# Patient Record
Sex: Male | Born: 1965 | Race: White | Hispanic: No | Marital: Married | Smoking: Never smoker
Health system: Southern US, Community
[De-identification: ages and names within clinical notes are randomized; demographics above are authoritative.]

## PROBLEM LIST (undated history)

## (undated) DIAGNOSIS — T7840XA Allergy, unspecified, initial encounter: Secondary | ICD-10-CM

## (undated) HISTORY — DX: Allergy, unspecified, initial encounter: T78.40XA

## (undated) HISTORY — PX: APPENDECTOMY: SHX54

---

## 2012-03-05 ENCOUNTER — Ambulatory Visit
Admission: RE | Admit: 2012-03-05 | Discharge: 2012-03-05 | Disposition: A | Discharge status: Home / Self Care | Payer: BC Managed Care – PPO | Source: Ambulatory Visit | Attending: Unknown Physician Specialty | Admitting: Unknown Physician Specialty

## 2012-03-05 ENCOUNTER — Other Ambulatory Visit: Payer: Self-pay | Admitting: Unknown Physician Specialty

## 2012-03-05 DIAGNOSIS — R202 Paresthesia of skin: Secondary | ICD-10-CM

## 2014-12-17 ENCOUNTER — Other Ambulatory Visit: Payer: Self-pay | Admitting: Unknown Physician Specialty

## 2014-12-17 DIAGNOSIS — G8929 Other chronic pain: Secondary | ICD-10-CM

## 2014-12-17 DIAGNOSIS — M545 Low back pain, unspecified: Secondary | ICD-10-CM

## 2014-12-22 ENCOUNTER — Ambulatory Visit
Admission: RE | Admit: 2014-12-22 | Discharge: 2014-12-22 | Disposition: A | Discharge status: Home / Self Care | Payer: BLUE CROSS/BLUE SHIELD | Source: Ambulatory Visit | Attending: Unknown Physician Specialty | Admitting: Unknown Physician Specialty

## 2014-12-22 DIAGNOSIS — M545 Low back pain, unspecified: Secondary | ICD-10-CM

## 2014-12-22 DIAGNOSIS — G8929 Other chronic pain: Secondary | ICD-10-CM

## 2014-12-31 ENCOUNTER — Other Ambulatory Visit: Payer: Self-pay | Admitting: Unknown Physician Specialty

## 2014-12-31 ENCOUNTER — Ambulatory Visit
Admission: RE | Admit: 2014-12-31 | Discharge: 2014-12-31 | Disposition: A | Discharge status: Home / Self Care | Payer: BLUE CROSS/BLUE SHIELD | Source: Ambulatory Visit | Attending: Unknown Physician Specialty | Admitting: Unknown Physician Specialty

## 2014-12-31 DIAGNOSIS — M545 Low back pain: Principal | ICD-10-CM

## 2014-12-31 DIAGNOSIS — G8929 Other chronic pain: Secondary | ICD-10-CM

## 2016-03-15 IMAGING — CR DG LUMBAR SPINE COMPLETE 4+V
5 series · 5 of 5 positions shown · non-contrast
Comparison: MRI of the lumbar spine December 22, 2014 and CT scan
of the abdomen and pelvis July 04, 2010.

CLINICAL DATA: Several month history of low back pain

EXAM:
LUMBAR SPINE - COMPLETE 4+ VIEW

[w lumbar spine ap]
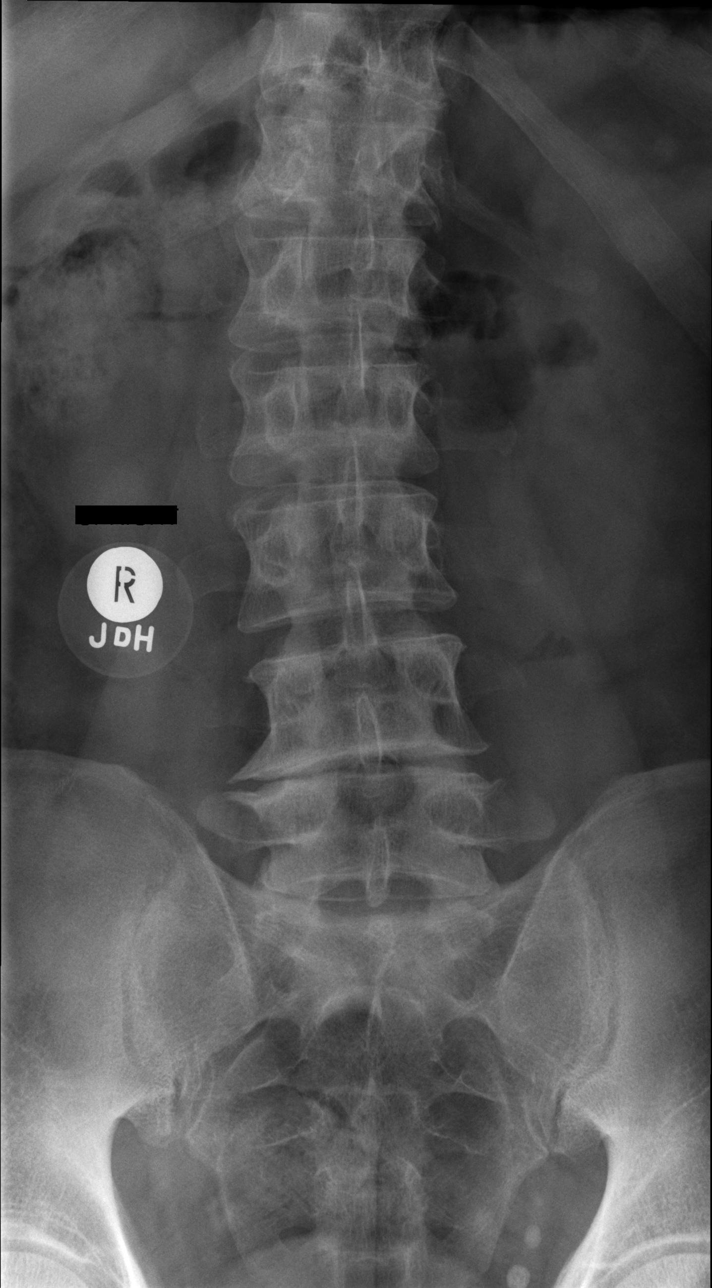

[w lumbar spine obl (1 of 2)]
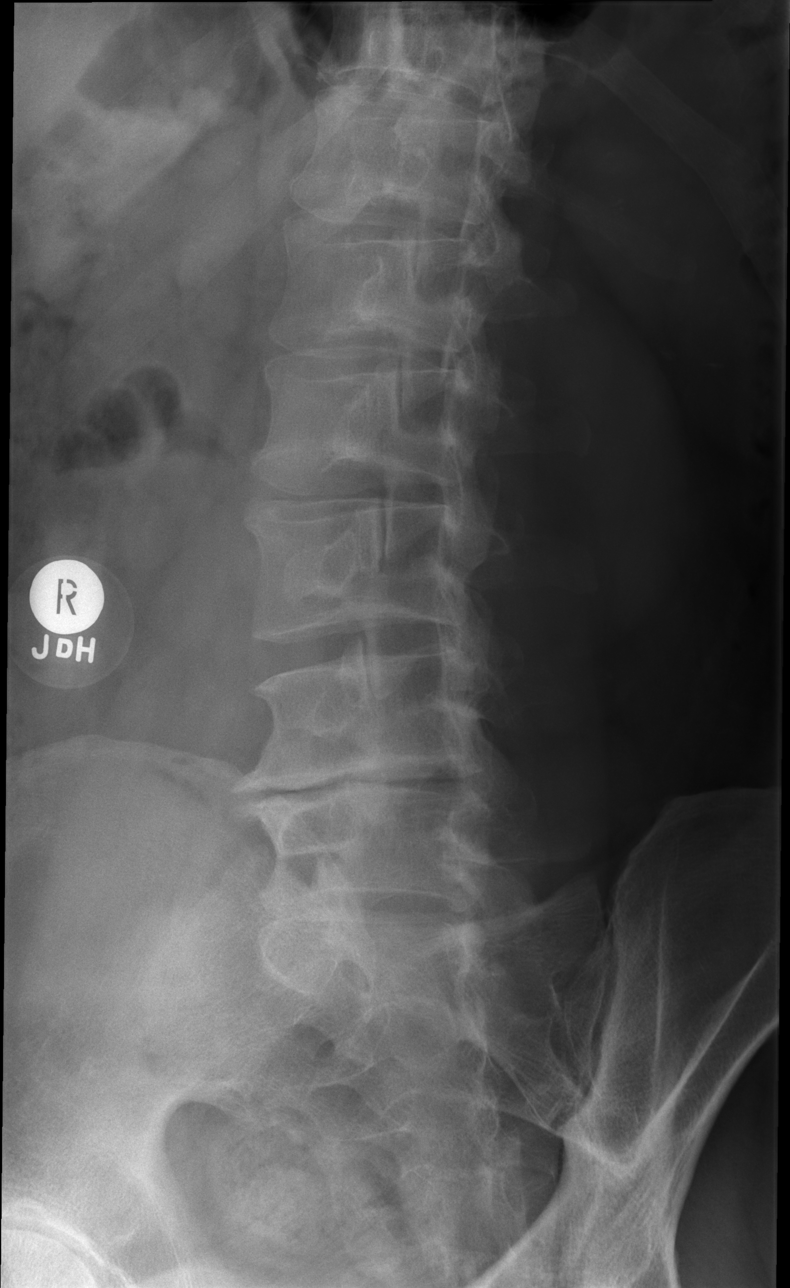

[w lumbar spine obl (2 of 2)]
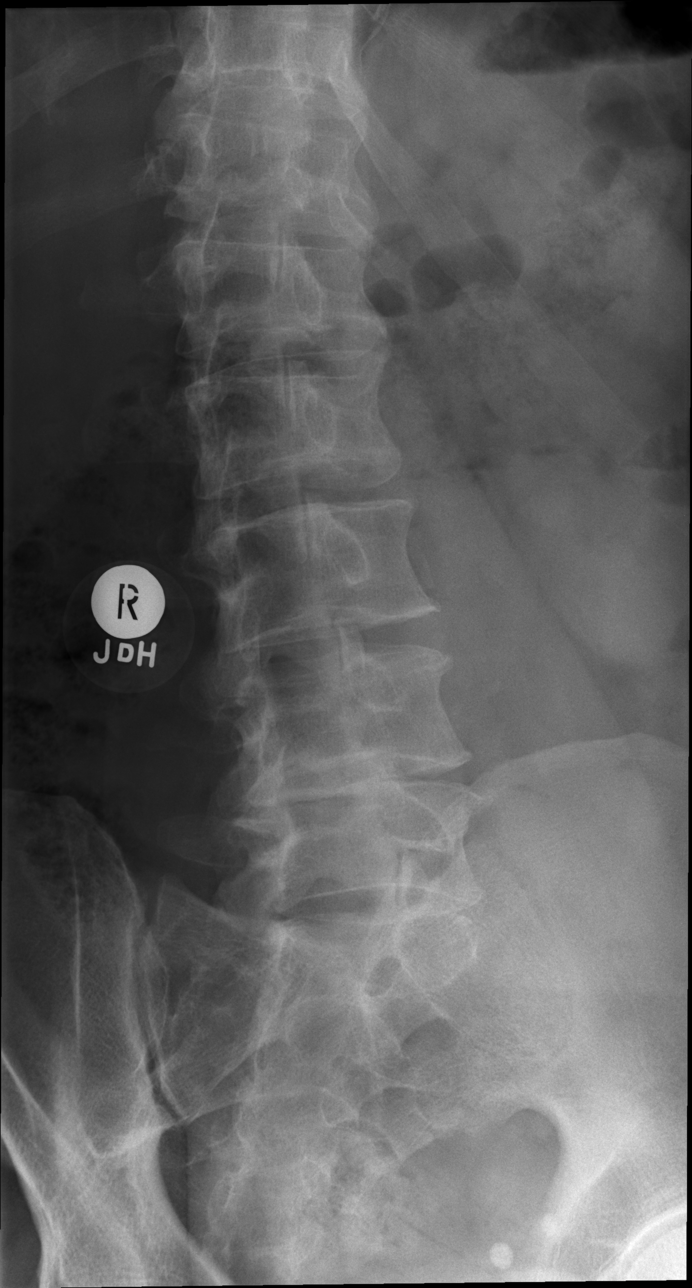

[w lumbar spine lat]
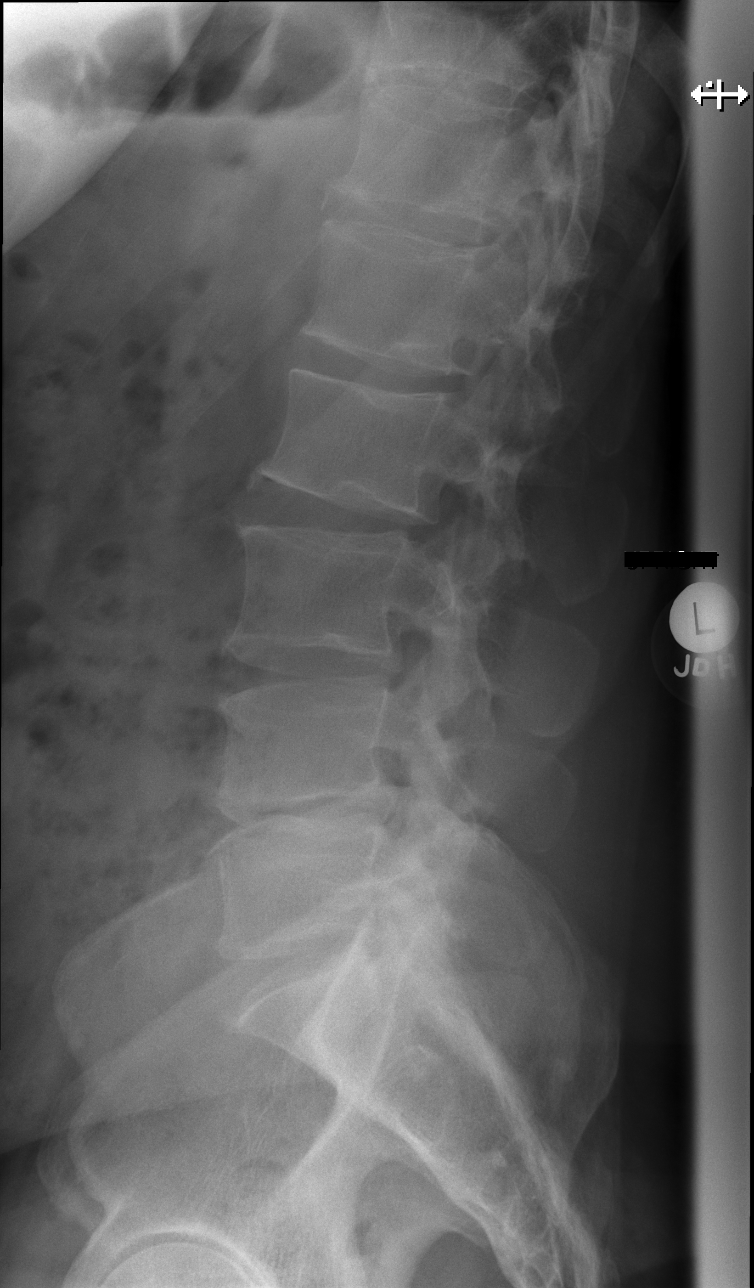

[w lumbar l-5 s-1 spot]
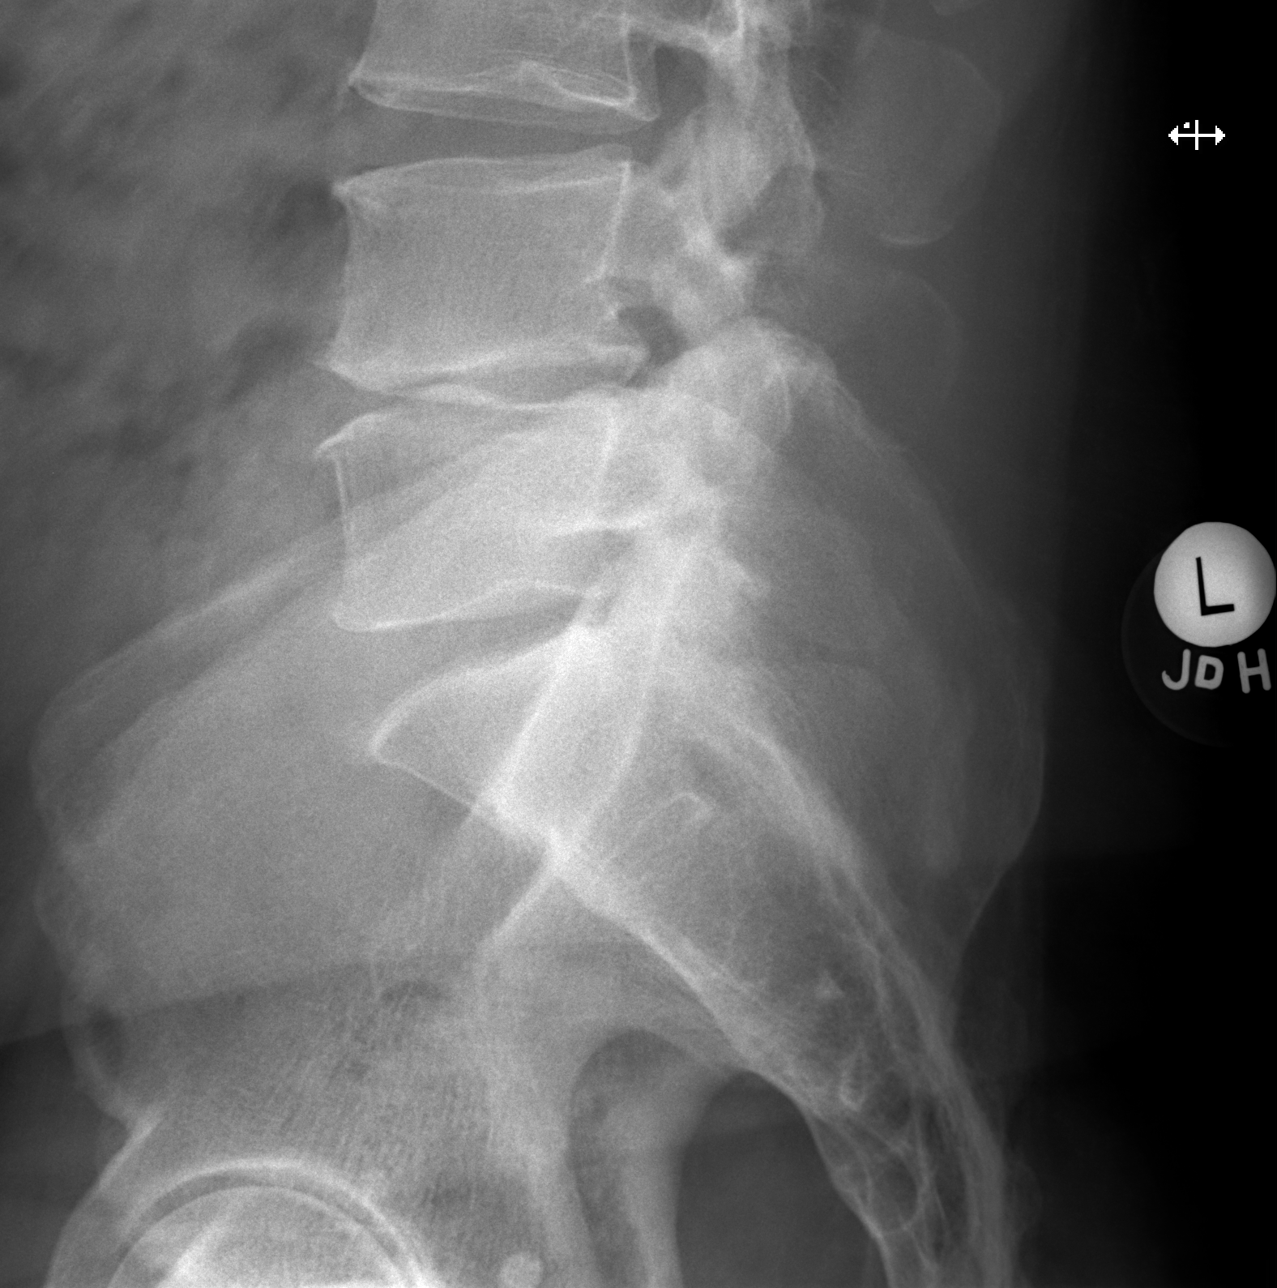

[5 of 5 positions shown; findings below may reference images not displayed]

FINDINGS: The lumbar vertebral bodies are preserved in height. Mild chronic
dextro curvature centered in the upper lumbar spine is noted. The
pedicles and transverse processes are intact. There is moderate
degenerative disc space narrowing at L4-5. There is no
spondylolisthesis. There is facet joint hypertrophy at L4-5 and at
L5-S1. The observed portions of the sacrum are normal.
IMPRESSION: There is degenerative disc disease at the L4-5 level with a
prominent right lateral near bridging osteophyte. There is no
compression fracture nor spondylolisthesis. There is mild facet
joint hypertrophy at L4-5 and at L5-S1.

## 2018-01-10 ENCOUNTER — Encounter: Payer: Self-pay | Admitting: Gastroenterology

## 2018-02-08 ENCOUNTER — Ambulatory Visit (AMBULATORY_SURGERY_CENTER): Payer: Self-pay

## 2018-02-08 VITALS — Ht 74.0 in | Wt 242.6 lb

## 2018-02-08 DIAGNOSIS — Z1211 Encounter for screening for malignant neoplasm of colon: Secondary | ICD-10-CM

## 2018-02-08 MED ORDER — NA SULFATE-K SULFATE-MG SULF 17.5-3.13-1.6 GM/177ML PO SOLN: 1.0000 | Freq: Once | ORAL | 0 refills | Status: AC

## 2018-02-08 NOTE — Progress Notes (Signed)
Denies allergies to eggs or soy products. Denies complication of anesthesia or sedation. Denies use of weight loss medication. Denies use of O2.   Emmi instructions declined.  

## 2018-02-25 ENCOUNTER — Encounter: Payer: Self-pay | Admitting: Gastroenterology

## 2018-03-01 ENCOUNTER — Ambulatory Visit (AMBULATORY_SURGERY_CENTER): Payer: BLUE CROSS/BLUE SHIELD | Admitting: Gastroenterology

## 2018-03-01 ENCOUNTER — Encounter: Payer: Self-pay | Admitting: Gastroenterology

## 2018-03-01 VITALS — BP 120/77 | HR 64 | Temp 97.5°F | Resp 17 | Ht 74.0 in | Wt 242.0 lb

## 2018-03-01 DIAGNOSIS — D123 Benign neoplasm of transverse colon: Secondary | ICD-10-CM

## 2018-03-01 DIAGNOSIS — Z1211 Encounter for screening for malignant neoplasm of colon: Secondary | ICD-10-CM | POA: Diagnosis present

## 2018-03-01 MED ORDER — SODIUM CHLORIDE 0.9 % IV SOLN: 500.0000 mL | Freq: Once | INTRAVENOUS | Status: DC

## 2018-03-01 NOTE — Progress Notes (Signed)
Pt's states no medical or surgical changes since previsit or office visit. 

## 2018-03-01 NOTE — Patient Instructions (Signed)
YOU HAD AN ENDOSCOPIC PROCEDURE TODAY AT [facility]:   Refer to the procedure report that was given to you for any specific questions about what was found during the examination.  If the procedure report does not answer your questions, please call your gastroenterologist to clarify.  If you requested that your care partner not be given the details of your procedure findings, then the procedure report has been included in a sealed envelope for you to review at your convenience later.  YOU SHOULD EXPECT: Some feelings of bloating in the abdomen. Passage of more gas than usual.  Walking can help get rid of the air that was put into your GI tract during the procedure and reduce the bloating. If you had a lower endoscopy (such as a colonoscopy or flexible sigmoidoscopy) you may notice spotting of blood in your stool or on the toilet paper. If you underwent a bowel prep for your procedure, you may not have a normal bowel movement for a few days.  Please Note:  You might notice some irritation and congestion in your nose or some drainage.  This is from the oxygen used during your procedure.  There is no need for concern and it should clear up in a day or so.  SYMPTOMS TO REPORT IMMEDIATELY:   Following lower endoscopy (colonoscopy or flexible sigmoidoscopy):  Excessive amounts of blood in the stool  Significant tenderness or worsening of abdominal pains  Swelling of the abdomen that is new, acute  Fever of 100F or higher   For urgent or emergent issues, a gastroenterologist can be reached at any hour by calling [phone].   DIET:  We do recommend a small meal at first, but then you may proceed to your regular diet.  Drink plenty of fluids but you should avoid alcoholic beverages for 24 hours.  ACTIVITY:  You should plan to take it easy for the rest of today and you should NOT DRIVE or use heavy machinery until tomorrow (because of the sedation medicines used during the test).     FOLLOW UP: Our staff will call the number listed on your records the next business day following your procedure to check on you and address any questions or concerns that you may have regarding the information given to you following your procedure. If we do not reach you, we will leave a message.  However, if you are feeling well and you are not experiencing any problems, there is no need to return our call.  We will assume that you have returned to your regular daily activities without incident.  If any biopsies were taken you will be contacted by phone or by letter within the next 1-3 weeks.  Please call us at [phone] if you have not heard about the biopsies in 3 weeks.    SIGNATURES/CONFIDENTIALITY: You and/or your care partner have signed paperwork which will be entered into your electronic medical record.  These signatures attest to the fact that that the information above on your After Visit Summary has been reviewed and is understood.  Full responsibility of the confidentiality of this discharge information lies with you and/or your care-partner.   Please see handout on polyps.   Thank you for allowing [place] to provide your healthcare today.

## 2018-03-01 NOTE — Op Note (Signed)
[facility] Patient Name: Phillip Moreno Procedure Date: 03/01/2018 10:18 AM MRN: [id number] Endoscopist: Remo Lipps P. Havery Moros , MD Age: 53 Referring MD:  Date of Birth: 11/11/1965 Gender: Male Account #: [id number] Procedure:                Colonoscopy Indications:              Screening for colorectal malignant neoplasm, This                            is the patient's first colonoscopy Medicines:                Monitored Anesthesia Care Procedure:                Pre-Anesthesia Assessment:                           - Prior to the procedure, a History and Physical                            was performed, and patient medications and                            allergies were reviewed. The patient's tolerance of                            previous anesthesia was also reviewed. The risks                            and benefits of the procedure and the sedation                            options and risks were discussed with the patient.                            All questions were answered, and informed consent                            was obtained. Prior Anticoagulants: The patient has                            taken no previous anticoagulant or antiplatelet                            agents. ASA Grade Assessment: II - A patient with                            mild systemic disease. After reviewing the risks                            and benefits, the patient was deemed in                            satisfactory condition to undergo the procedure.  After obtaining informed consent, the colonoscope                            was passed under direct vision. Throughout the                            procedure, the patient's blood pressure, pulse, and                            oxygen saturations were monitored continuously. The                            Colonoscope was introduced through the anus and                            advanced to the the  cecum, identified by                            appendiceal orifice and ileocecal valve. The                            colonoscopy was performed without difficulty. The                            patient tolerated the procedure well. The quality                            of the bowel preparation was good. The ileocecal                            valve, appendiceal orifice, and rectum were                            photographed. Scope In: 10:21:35 AM Scope Out: 10:42:14 AM Scope Withdrawal Time: 0 hours 13 minutes 38 seconds  Total Procedure Duration: 0 hours 20 minutes 39 seconds  Findings:                 The perianal and digital rectal examinations were                            normal.                           A diminutive polyp was found in the transverse                            colon. The polyp was sessile. The polyp was removed                            with a cold snare. Resection and retrieval were                            complete.  Internal hemorrhoids were found during                            retroflexion. The hemorrhoids were small.                           The exam was otherwise without abnormality. Complications:            No immediate complications. Estimated blood loss:                            Minimal. Estimated Blood Loss:     Estimated blood loss was minimal. Impression:               - One diminutive polyp in the transverse colon,                            removed with a cold snare. Resected and retrieved.                           - Small internal hemorrhoids.                           - The examination was otherwise normal. Recommendation:           - Patient has a contact number available for                            emergencies. The signs and symptoms of potential                            delayed complications were discussed with the                            patient. Return to normal activities tomorrow.                             Written discharge instructions were provided to the                            patient.                           - Resume previous diet.                           - Continue present medications.                           - Await pathology results. Remo Lipps P. Partick Musselman, MD 03/01/2018 10:44:58 AM This report has been signed electronically.

## 2018-03-01 NOTE — Progress Notes (Signed)
PT taken to PACU. Monitors in place. VSS. Report given to RN. 

## 2018-03-04 ENCOUNTER — Telehealth: Payer: Self-pay

## 2018-03-04 NOTE — Telephone Encounter (Signed)
  Follow up Call-  Call back number 03/01/2018  Post procedure Call Back phone  # [phone]  Permission to leave phone message Yes  Some recent data might be hidden     Patient questions:  Do you have a fever, pain , or abdominal swelling? No. Pain Score  0 *  Have you tolerated food without any problems? Yes.    Have you been able to return to your normal activities? Yes.    Do you have any questions about your discharge instructions: Diet   No. Medications  No. Follow up visit  No.  Do you have questions or concerns about your Care? No.  Actions: * If pain score is 4 or above: No action needed, pain <4.

## 2018-03-07 ENCOUNTER — Encounter: Payer: Self-pay | Admitting: Gastroenterology

## 2022-12-07 ENCOUNTER — Other Ambulatory Visit: Payer: Self-pay | Admitting: Internal Medicine

## 2022-12-07 DIAGNOSIS — E785 Hyperlipidemia, unspecified: Secondary | ICD-10-CM

## 2022-12-11 ENCOUNTER — Encounter: Payer: Self-pay | Admitting: Gastroenterology

## 2022-12-26 ENCOUNTER — Encounter: Payer: Self-pay | Admitting: Gastroenterology

## 2023-01-09 ENCOUNTER — Ambulatory Visit (HOSPITAL_BASED_OUTPATIENT_CLINIC_OR_DEPARTMENT_OTHER)
Admission: RE | Admit: 2023-01-09 | Discharge: 2023-01-09 | Disposition: A | Discharge status: Home / Self Care | Payer: BLUE CROSS/BLUE SHIELD | Source: Ambulatory Visit | Attending: Internal Medicine | Admitting: Internal Medicine

## 2023-01-09 DIAGNOSIS — E785 Hyperlipidemia, unspecified: Secondary | ICD-10-CM | POA: Insufficient documentation

## 2023-03-02 ENCOUNTER — Ambulatory Visit (AMBULATORY_SURGERY_CENTER): Payer: 59

## 2023-03-02 VITALS — Ht 74.0 in | Wt 235.0 lb

## 2023-03-02 DIAGNOSIS — Z860100 Personal history of colon polyps, unspecified: Secondary | ICD-10-CM

## 2023-03-02 MED ORDER — NA SULFATE-K SULFATE-MG SULF 17.5-3.13-1.6 GM/177ML PO SOLN: 1.0000 | Freq: Once | ORAL | 0 refills | Status: AC

## 2023-03-02 NOTE — Progress Notes (Signed)

## 2023-03-09 ENCOUNTER — Encounter: Payer: Self-pay | Admitting: Gastroenterology

## 2023-03-11 ENCOUNTER — Encounter: Payer: Self-pay | Admitting: Certified Registered Nurse Anesthetist

## 2023-03-14 ENCOUNTER — Encounter: Payer: Self-pay | Admitting: Gastroenterology

## 2023-03-16 ENCOUNTER — Ambulatory Visit (AMBULATORY_SURGERY_CENTER): Payer: 59 | Admitting: Gastroenterology

## 2023-03-16 ENCOUNTER — Encounter: Payer: Self-pay | Admitting: Gastroenterology

## 2023-03-16 VITALS — BP 103/82 | HR 65 | Temp 97.5°F | Resp 12 | Ht 74.0 in | Wt 235.0 lb

## 2023-03-16 DIAGNOSIS — Z1211 Encounter for screening for malignant neoplasm of colon: Secondary | ICD-10-CM

## 2023-03-16 DIAGNOSIS — K648 Other hemorrhoids: Secondary | ICD-10-CM

## 2023-03-16 DIAGNOSIS — Z860100 Personal history of colon polyps, unspecified: Secondary | ICD-10-CM

## 2023-03-16 MED ORDER — SODIUM CHLORIDE 0.9 % IV SOLN: 500.0000 mL | Freq: Once | INTRAVENOUS | Status: DC

## 2023-03-16 NOTE — Patient Instructions (Signed)
YOU HAD AN ENDOSCOPIC PROCEDURE TODAY AT THE [facility]:   Refer to the procedure report that was given to you for any specific questions about what was found during the examination.  If the procedure report does not answer your questions, please call your gastroenterologist to clarify.  If you requested that your care partner not be given the details of your procedure findings, then the procedure report has been included in a sealed envelope for you to review at your convenience later.  YOU SHOULD EXPECT: Some feelings of bloating in the abdomen. Passage of more gas than usual.  Walking can help get rid of the air that was put into your GI tract during the procedure and reduce the bloating. If you had a lower endoscopy (such as a colonoscopy or flexible sigmoidoscopy) you may notice spotting of blood in your stool or on the toilet paper. If you underwent a bowel prep for your procedure, you may not have a normal bowel movement for a few days.  Please Note:  You might notice some irritation and congestion in your nose or some drainage.  This is from the oxygen used during your procedure.  There is no need for concern and it should clear up in a day or so.  SYMPTOMS TO REPORT IMMEDIATELY:  Following lower endoscopy (colonoscopy or flexible sigmoidoscopy):  Excessive amounts of blood in the stool  Significant tenderness or worsening of abdominal pains  Swelling of the abdomen that is new, acute  Fever of 100F or higher  For urgent or emergent issues, a gastroenterologist can be reached at any hour by calling [phone]. Do not use MyChart messaging for urgent concerns.    DIET:  We do recommend a small meal at first, but then you may proceed to your regular diet.  Drink plenty of fluids but you should avoid alcoholic beverages for 24 hours.  ACTIVITY:  You should plan to take it easy for the rest of today and you should NOT DRIVE or use heavy machinery until tomorrow (because of  the sedation medicines used during the test).    FOLLOW UP: Our staff will call the number listed on your records the next business day following your procedure.  We will call around 7:15- 8:00 am to check on you and address any questions or concerns that you may have regarding the information given to you following your procedure. If we do not reach you, we will leave a message.     If any biopsies were taken you will be contacted by phone or by letter within the next 1-3 weeks.  Please call us at [phone] if you have not heard about the biopsies in 3 weeks.    SIGNATURES/CONFIDENTIALITY: You and/or your care partner have signed paperwork which will be entered into your electronic medical record.  These signatures attest to the fact that that the information above on your After Visit Summary has been reviewed and is understood.  Full responsibility of the confidentiality of this discharge information lies with you and/or your care-partner.

## 2023-03-16 NOTE — Progress Notes (Signed)
Pt's states no medical or surgical changes since previsit or office visit.

## 2023-03-16 NOTE — Progress Notes (Signed)
Report given to PACU, vss

## 2023-03-16 NOTE — Progress Notes (Signed)
[facility] History and Physical   Primary Care Physician:  Ozella Rocks, MD   Reason for Procedure:   History of colon polyps  Plan:    colonoscopy     HPI: Phillip Moreno is a 58 y.o. male  here for colonoscopy surveillance - adenoma removed 02/2018.   Patient denies any bowel symptoms at this time. No family history of colon cancer known. Otherwise feels well without any cardiopulmonary symptoms.   I have discussed risks / benefits of anesthesia and endoscopic procedure with Phillip Moreno and they wish to proceed with the exams as outlined today.    Past Medical History:  Diagnosis Date   Allergy     Past Surgical History:  Procedure Laterality Date   APPENDECTOMY      Prior to Admission medications   Medication Sig Start Date End Date Taking? Authorizing Provider  acetaminophen (TYLENOL) 500 MG tablet Take 500 mg by mouth every 6 (six) hours as needed.    [provider]  OVER THE COUNTER MEDICATION Tussin Dm Max Cough Syrup 20 ml. 2 times daily. Patient not taking: Reported on 03/02/2023    [provider]  OVER THE COUNTER MEDICATION Non drowsy Allergy and cold relief- D. 1 pill daily.    [provider]    Current Outpatient Medications  Medication Sig Dispense Refill   acetaminophen (TYLENOL) 500 MG tablet Take 500 mg by mouth every 6 (six) hours as needed.     OVER THE COUNTER MEDICATION Tussin Dm Max Cough Syrup 20 ml. 2 times daily. (Patient not taking: Reported on 03/02/2023)     OVER THE COUNTER MEDICATION Non drowsy Allergy and cold relief- D. 1 pill daily.     Current Facility-Administered Medications  Medication Dose Route Frequency Provider Last Rate Last Admin   0.9 %  sodium chloride infusion  500 mL Intravenous Once Keidra Withers, Willaim Rayas, MD        Allergies as of 03/16/2023   (No Known Allergies)    Family History  Problem Relation Age of Onset   Colon cancer Neg Hx    Esophageal cancer Neg Hx     Rectal cancer Neg Hx    Stomach cancer Neg Hx     Social History   Socioeconomic History   Marital status: Married    Spouse name: Not on file   Number of children: Not on file   Years of education: Not on file   Highest education level: Not on file  Occupational History   Not on file  Tobacco Use   Smoking status: Never   Smokeless tobacco: Never  Vaping Use   Vaping status: Never Used  Substance and Sexual Activity   Alcohol use: Never   Drug use: Never   Sexual activity: Not on file  Other Topics Concern   Not on file  Social History Narrative   Not on file   Social Drivers of Health   Financial Resource Strain: Not on file  Food Insecurity: Not on file  Transportation Needs: Not on file  Physical Activity: Not on file  Stress: Not on file  Social Connections: Not on file  Intimate Partner Violence: Not on file    Review of Systems: All other review of systems negative except as mentioned in the HPI.  Physical Exam: Vital signs BP 129/73   Pulse 78   Temp (!) 97.5 F (36.4 C)   Ht 6\' 2"  (1.88 m)   Wt 235 lb (106.6 kg)  SpO2 97%   BMI 30.17 kg/m   General:   Alert,  Well-developed, pleasant and cooperative in NAD Lungs:  Clear throughout to auscultation.   Heart:  Regular rate and rhythm Abdomen:  Soft, nontender and nondistended.   Neuro/Psych:  Alert and cooperative. Normal mood and affect. A and O x 3  Phillip Rain, MD [facility]

## 2023-03-16 NOTE — Op Note (Signed)
[facility] Patient Name: Quindarius Cabello Procedure Date: 03/16/2023 8:46 AM MRN: [id number] Endoscopist: Viviann Spare P. Adela Lank , MD, [id number] Age: 58 Referring MD:  Date of Birth: [birth year] Gender: Male Account #: [id number] Procedure:                Colonoscopy Indications:              High risk colon cancer surveillance: Personal                            history of colonic polyps - adenoma removed 02/2018 Medicines:                Monitored Anesthesia Care Procedure:                Pre-Anesthesia Assessment:                           - Prior to the procedure, a History and Physical                            was performed, and patient medications and                            allergies were reviewed. The patient's tolerance of                            previous anesthesia was also reviewed. The risks                            and benefits of the procedure and the sedation                            options and risks were discussed with the patient.                            All questions were answered, and informed consent                            was obtained. Prior Anticoagulants: The patient has                            taken no anticoagulant or antiplatelet agents. ASA                            Grade Assessment: I - A normal, healthy patient.                            After reviewing the risks and benefits, the patient                            was deemed in satisfactory condition to undergo the                            procedure.  After obtaining informed consent, the colonoscope                            was passed under direct vision. Throughout the                            procedure, the patient's blood pressure, pulse, and                            oxygen saturations were monitored continuously. The                            Olympus Scope SN [id number] was introduced through the                            anus and  advanced to the the cecum, identified by                            appendiceal orifice and ileocecal valve. The                            colonoscopy was performed without difficulty. The                            patient tolerated the procedure well. The quality                            of the bowel preparation was good. The ileocecal                            valve, appendiceal orifice, and rectum were                            photographed. Scope In: 8:57:07 AM Scope Out: 9:13:12 AM Scope Withdrawal Time: 0 hours 13 minutes 17 seconds  Total Procedure Duration: 0 hours 16 minutes 5 seconds  Findings:                 The perianal and digital rectal examinations were                            normal.                           Small internal hemorrhoids were found during                            retroflexion. The hemorrhoids were small.                           The exam was otherwise without abnormality. Complications:            No immediate complications. Estimated blood loss:                            None. Estimated Blood Loss:  Estimated blood loss: none. Impression:               - Internal hemorrhoids.                           - The examination was otherwise normal.                           - No polyps Recommendation:           - Patient has a contact number available for                            emergencies. The signs and symptoms of potential                            delayed complications were discussed with the                            patient. Return to normal activities tomorrow.                            Written discharge instructions were provided to the                            patient.                           - Resume previous diet.                           - Continue present medications.                           - Repeat colonoscopy in 10 years for surveillance. Viviann Spare P. Wilhelmenia Addis, MD 03/16/2023 9:18:04 AM This report has been signed  electronically.

## 2023-03-19 ENCOUNTER — Telehealth: Payer: Self-pay

## 2023-03-19 NOTE — Telephone Encounter (Signed)
  Follow up Call-     03/16/2023    8:22 AM  Call back number  Post procedure Call Back phone  # [phone]  Permission to leave phone message Yes     Patient questions:  Do you have a fever, pain , or abdominal swelling? No. Pain Score  0 *  Have you tolerated food without any problems? Yes.    Have you been able to return to your normal activities? Yes.    Do you have any questions about your discharge instructions: Diet   No. Medications  No. Follow up visit  No.  Do you have questions or concerns about your Care? No.  Actions: * If pain score is 4 or above: No action needed, pain <4.
